# Patient Record
Sex: Male | Born: 1935 | Race: White | Hispanic: No
Health system: Southern US, Community
[De-identification: ages and names within clinical notes are randomized; demographics above are authoritative.]

---

## 2013-02-06 LAB — CBC WITH DIFFERENTIAL/PLATELET
Basophil #: 0 10*3/uL (ref 0.0–0.1)
Basophil %: 0.3 %
HCT: 43.7 % (ref 40.0–52.0)
MCHC: 34.4 g/dL (ref 32.0–36.0)
MCV: 91 fL (ref 80–100)
Neutrophil #: 14.5 10*3/uL — ABNORMAL HIGH (ref 1.4–6.5)
RBC: 4.79 10*6/uL (ref 4.40–5.90)
RDW: 13.9 % (ref 11.5–14.5)

## 2013-02-06 LAB — COMPREHENSIVE METABOLIC PANEL
Albumin: 3.5 g/dL (ref 3.4–5.0)
BUN: 20 mg/dL — ABNORMAL HIGH (ref 7–18)
Bilirubin,Total: 0.9 mg/dL (ref 0.2–1.0)
Calcium, Total: 9.1 mg/dL (ref 8.5–10.1)
Co2: 27 mmol/L (ref 21–32)
Creatinine: 0.98 mg/dL (ref 0.60–1.30)
EGFR (Non-African Amer.): >60
Glucose: 117 mg/dL — ABNORMAL HIGH (ref 65–99)
Osmolality: 276 (ref 275–301)
Potassium: 4.5 mmol/L (ref 3.5–5.1)
SGPT (ALT): 20 U/L (ref 12–78)
Sodium: 136 mmol/L (ref 136–145)

## 2013-02-06 LAB — URINALYSIS, COMPLETE
Bacteria: NONE SEEN
Blood: NEGATIVE
Glucose,UR: NEGATIVE mg/dL (ref 0–75)
Hyaline Cast: 11
Ketone: NEGATIVE
Leukocyte Esterase: NEGATIVE
Nitrite: NEGATIVE
Ph: 6 (ref 4.5–8.0)
RBC,UR: 6 /HPF (ref 0–5)
Specific Gravity: 1.026 (ref 1.003–1.030)
WBC UR: 3 /HPF (ref 0–5)

## 2013-02-06 LAB — PRO B NATRIURETIC PEPTIDE: B-Type Natriuretic Peptide: 5873 pg/mL — ABNORMAL HIGH (ref 0–450)

## 2013-02-06 LAB — TROPONIN I: Troponin-I: 0.03 ng/mL

## 2013-02-06 LAB — PROTIME-INR: INR: 1

## 2013-02-07 ENCOUNTER — Inpatient Hospital Stay: Payer: Self-pay | Admitting: Student

## 2013-02-07 LAB — BASIC METABOLIC PANEL
Calcium, Total: 8.7 mg/dL (ref 8.5–10.1)
Chloride: 104 mmol/L (ref 98–107)
Creatinine: 1.01 mg/dL (ref 0.60–1.30)
EGFR (African American): >60
EGFR (Non-African Amer.): >60
Osmolality: 275 (ref 275–301)
Sodium: 137 mmol/L (ref 136–145)

## 2013-02-07 LAB — CBC WITH DIFFERENTIAL/PLATELET
Basophil #: 0.1 10*3/uL (ref 0.0–0.1)
Eosinophil %: 3.6 %
HCT: 41.4 % (ref 40.0–52.0)
HGB: 14.2 g/dL (ref 13.0–18.0)
Lymphocyte #: 2.3 10*3/uL (ref 1.0–3.6)
MCH: 31.4 pg (ref 26.0–34.0)
MCHC: 34.3 g/dL (ref 32.0–36.0)
MCV: 91 fL (ref 80–100)
Monocyte #: 0.8 x10 3/mm (ref 0.2–1.0)
Monocyte %: 8.5 %
Neutrophil %: 62.4 %
Platelet: 115 10*3/uL — ABNORMAL LOW (ref 150–440)
RBC: 4.53 10*6/uL (ref 4.40–5.90)
RDW: 13.9 % (ref 11.5–14.5)

## 2013-02-07 LAB — LIPID PANEL
HDL Cholesterol: 39 mg/dL — ABNORMAL LOW (ref 40–60)
Ldl Cholesterol, Calc: 98 mg/dL (ref 0–100)

## 2013-06-10 ENCOUNTER — Emergency Department: Payer: Self-pay | Admitting: Emergency Medicine

## 2013-06-10 LAB — CBC WITH DIFFERENTIAL/PLATELET
BASOS ABS: 0.1 10*3/uL (ref 0.0–0.1)
Basophil %: 0.7 %
EOS ABS: 1.6 10*3/uL — AB (ref 0.0–0.7)
EOS PCT: 15.6 %
HCT: 45.7 % (ref 40.0–52.0)
HGB: 15.3 g/dL (ref 13.0–18.0)
Lymphocyte #: 2.8 10*3/uL (ref 1.0–3.6)
Lymphocyte %: 27.4 %
MCH: 31.5 pg (ref 26.0–34.0)
MCHC: 33.4 g/dL (ref 32.0–36.0)
MCV: 94 fL (ref 80–100)
MONOS PCT: 8.1 %
Monocyte #: 0.8 x10 3/mm (ref 0.2–1.0)
Neutrophil #: 4.9 10*3/uL (ref 1.4–6.5)
Neutrophil %: 48.2 %
PLATELETS: 133 10*3/uL — AB (ref 150–440)
RBC: 4.85 10*6/uL (ref 4.40–5.90)
RDW: 13.8 % (ref 11.5–14.5)
WBC: 10.2 10*3/uL (ref 3.8–10.6)

## 2013-06-10 LAB — URINALYSIS, COMPLETE
BILIRUBIN, UR: NEGATIVE
Bacteria: NONE SEEN
Blood: NEGATIVE
GLUCOSE, UR: NEGATIVE mg/dL (ref 0–75)
Ketone: NEGATIVE
Leukocyte Esterase: NEGATIVE
NITRITE: NEGATIVE
Ph: 5 (ref 4.5–8.0)
Protein: NEGATIVE
RBC,UR: NONE SEEN /HPF (ref 0–5)
Specific Gravity: 1.016 (ref 1.003–1.030)
Squamous Epithelial: NONE SEEN
WBC UR: <1 /HPF (ref 0–5)

## 2013-06-10 LAB — BASIC METABOLIC PANEL
ANION GAP: 1 — AB (ref 7–16)
BUN: 16 mg/dL (ref 7–18)
CALCIUM: 9.5 mg/dL (ref 8.5–10.1)
CHLORIDE: 105 mmol/L (ref 98–107)
CREATININE: 1 mg/dL (ref 0.60–1.30)
Co2: 31 mmol/L (ref 21–32)
EGFR (African American): >60
EGFR (Non-African Amer.): >60
Glucose: 98 mg/dL (ref 65–99)
Osmolality: 275 (ref 275–301)
POTASSIUM: 4.8 mmol/L (ref 3.5–5.1)
SODIUM: 137 mmol/L (ref 136–145)

## 2013-06-10 LAB — CK TOTAL AND CKMB (NOT AT ARMC)
CK, Total: 35 U/L — ABNORMAL LOW
CK-MB: 1.4 ng/mL (ref 0.5–3.6)

## 2013-06-10 LAB — TROPONIN I: Troponin-I: <0.02 ng/mL

## 2013-11-23 DEATH — deceased

## 2014-08-15 NOTE — Discharge Summary (Signed)
PATIENT NAME:  Glenn Holt, Glenn Holt MR#:  [id number] DATE OF BIRTH:  November 14, 1917  DATE OF ADMISSION:  02/07/2013 DATE OF DISCHARGE:  02/10/2013  CONSULTANTS: Physical therapy, speech therapy.   CHIEF COMPLAINT: Left-sided weakness.   DISCHARGE DIAGNOSES: 1.  Left-sided weakness with suspected stroke.  2.  Acute respiratory failure secondary to likely aspiration into airway.  3.  Dysphagia.  4.  History of dementia.  5.  Hypertension.  6.  Leukocytosis, possibly from stroke.  7.  Chronic systolic congestive heart failure with ejection fraction of about 35% to 40%.  8.  Carotid arterial disease.  9.  Coronary artery disease, status post bypass.   DISCHARGE MEDICATIONS: Plavix 75 mg daily, isosorbide mononitrate 60 mg once a day, aspirin 81 mg daily, atenolol 50 mg once a day, furosemide 20 mg once a day, loratadine 10 mg once a day, multivitamin 1 tab daily, docusate sodium 100 mg 2 times a day, Nitrostat 0.4 mg sublingual q.5 minutes as needed for pain in the chest x 3, Seroquel 25 mg at bedtime, chlorhexidine swish-and-spit 15 mL twice a day after meals, Mapap 500 mg every 6 hours as needed for fever or pain, triple antibiotics applied b.i.d. p.r.n., simvastatin 10 mg at bedtime, albuterol nebulizers 2.5 mg every 6 hours as needed for shortness of breath, prednisone taper 40 mg a day taper by 10 mg until done in 4 days, lisinopril 2.5 mg daily.   DIET: Low sodium. Diet consistency is pureed with nectar-thick liquids, strict aspiration precautions. Medicines pureed, feeding assistant at all times.   ACTIVITY: As tolerated.   REFERRALS: To hospice.   FOLLOWUP: Please follow with PCP within 1 to 2 weeks. He will go back to [facility] with hospice.   SIGNIFICANT LABS AND IMAGING: Initial BNP was 5873. BUN 20, creatinine 0.98. LDL of 98. LFTs were within normal limits. Troponin was negative. Initial WBC 16.4, last WBC 9.2, initial hemoglobin 15. INR 1. UA did not suggest infection on admission.  Echocardiogram on October 16th showing EF of 35% to 40% with some regional wall motion abnormalities, moderately dilated right and left atrium, moderate to severe mitral valve regurg, moderate tricuspid regurg, moderate RV wall thickness. CT of the head without contrast on admission shows chronic involutional changes without evidence of acute stroke. CT angio of the neck shows no evidence of occlusive vascular disease, there does not appear to be a high degree of stenosis on CT angio. This is in contradiction to the previous Doppler. Stenosis appears to be approximately 50% in both internal carotids. Vascular surgical consult is recommended to assess for possible carotid arteriography. Ultrasound of the carotids bilateral doe on October 15th showing consistent with total occlusion of the internal carotid arteries distal to the carotid bulb. X-ray of the chest, 1 view on October 15th, showing findings suggesting low-grade CHF, no evidence of pneumonia. Repeat x-ray on October 17th showing low-grade CHF.    HISTORY OF PRESENT ILLNESS AND HOSPITAL COURSE: For full details of H and P, please see the dictation on October 15th by Dr. Luberta Mutter but, briefly, this is a 81 year old male with dementia who lives at [facility], brought in by EMS after he was found slumped over to the left side while he was using the toilet and brought in for TIA evaluation. He has been having some cough for about a week. No fever was documented. He was admitted to the hospitalist service for suspected stroke. He had a CT of the head which was negative for  acute stroke but did have EKG showing A. fib.  There is no clear history of arteriovenous but we do not have any previous EKGs. He was admitted to telemetry with possible stroke. Given the A. fib and the patient's presentation, stroke was suspected. Furthermore, carotid ultrasound was done showing significant 100% stenosis. Echocardiogram was also done showing EF of 35% to 40%. Given the  ultrasound showing carotid stenosis, MRI and MRA was ordered; however, the patient has history of renal stents and as they could not get information about the stents MRI was not done. Initially, the family did not want to pursue the MRI, MRA as the patient is not a candidate for aggressive management and the family does not want aggressive measures, and he was continued on aspirin and Plavix. Statin was started; however, the case was discussed with another son who wondered about the possibility of CT angio. A CT angiogram of the neck was done which showed no evidence of occlusive vascular disease. I discussed the case with the patient's son again about the findings. The patient has another son who is a physician. The plan at this point was get images and show to another neurosurgeon that they are familiar with. However, at this point as the CT angio does not show significant occlusion which was seen on the ultrasound, conservative and wait-and-watch approach will be taken.   LDL was checked. The patient was also seen by PT. The patient also does have atrial fibrillation as stated above and it is unclear if this is acute or chronic but, this point, he is not on anticoagulation. It is possible that this might be embolic but, given the overall situation, I do not know if the family wants aggressive measures such as Coumadin They will go ahead and think about it. The patient was seen by speech. He does have evidence for dysphagia and he does cough with eating. He did have acute respiratory failure, wheezing, and aspiration is suspected. He was started on some albuterol and nebs, steroids. He was also started on some antibiotics initially. He has done better. He is tolerating his diet. His strength has improved. He was seen by PT. The family is wanting to transfer him back with hospice at [facility] and that will be arranged. He did have echo showing EF of 35% to 40% but he does not appear to be in acute congestive  heart failure. He has a history of CHF and he is on Lasix already. Lisinopril was added to his medication as he is not on it as an outpatient. He did have leukocytosis which possibly is stress mediated and this has resolved. He has received multiple days of IV ceftriaxone and some azithromycin and, at this point, he will not be discharged on antibiotics. He will be sent on steroid taper as well as albuterol nebs. At this point, he will be discharged to outpatient followup.   PHYSICAL EXAMINATION: VITAL SIGNS: On the day of discharge, his temperature is 98, pulse rate 51, respiratory rate 18, blood pressure 158/54. O2 sat has been in the low to high 90s in the last 24 hours, today sat is 91% on room air.  GENERAL: The patient is a well-developed, elderly male laying in bed, confused but pleasant, following all commands.  HEENT: Normocephalic, atraumatic.  LUNGS: Showed no significant wheezing but is a little coarse on the left upper lobe. No significant crackles.  HEART:  Irregularly irregular on auscultation of his heart sounds.  ABDOMEN: Benign. The patient  has no significant lower extremity edema.   DISPOSITION:  At this point, he will be discharged with outpatient followup with hospice.   TOTAL TIME SPENT: 40 minutes.   CODE STATUS: The patient is DO NOT RESUSCITATE.   ____________________________ Krystal Eaton, MD sa:cs D: 02/10/2013 12:09:00 ET T: 02/10/2013 20:16:03 ET JOB#: [id number]  cc: Krystal Eaton, MD, <Dictator> Krystal Eaton MD ELECTRONICALLY SIGNED 02/22/2013 14:54

## 2014-08-15 NOTE — H&P (Signed)
PATIENT NAME:  Glenn Holt, Glenn Holt MR#:  [id number] DATE OF BIRTH:  [birth year]  DATE OF ADMISSION:  02/06/2013  PRIMARY CARE PHYSICIAN: From   [facility]  The patient is from [place], [place].   CHIEF COMPLAINT: Left side weakness and possible transient ischemic attack.   HISTORY OF PRESENT ILLNESS: A 81 year old male with dementia who lives at [facility] brought in by the EMS because he was slumped over to the left side while he was using the toilet. The patient was brought here for TIA evaluation. The patient has dementia, so not really a good history obtained from him. The patient thinks he is in [place].  The patient's son says he has had a cough for about 1 week.  No fever was documented. The patient has been living at [facility] recently,.  Before that he was in [place]. According to the son, he had pneumonia 2 years ago. The patient did not have any slurred speech and no evidence of seizure activity. No evidence of trauma. The patient did not lose consciousness. He denies any recent history of travel in the last 21 days to [place] countries.   PAST MEDICAL HISTORY: Significant for history of dementia, hypertension, CHF and coronary artery disease, status post bypass.   SOCIAL HISTORY: No smoking. Occasional drinking, no drugs. Lives at [facility]   PAST SURGICAL HISTORY: Significant for quadruple bypass.   FAMILY HISTORY: Significant for hyperlipidemia in his brothers and sisters.   PAST SURGICAL HISTORY: Significant for bypass surgery 20 years ago.   MEDICATIONS:   1.  Aspirin 81 mg daily. 2.  Atenolol 50 mg p.o. daily. 3.  Chlorhexidine mouth wash swish and spit as needed. 4.  Colace 100 mg p.o. daily.  5.  Lasix 20 mg p.o. daily.  6.  Imdur 60 mg p.o. extended release daily.  7.  Loratadine 10 mg p.o. daily.  8. tylenol  500 mg every 6 hours as needed for pain and fever.  9.  Multivitamin 1 tablet daily.  10.  Plavix 75 mg p.o. daily. y.  71.  Nitrostat  sublingual as needed 0.4 mg for chest pain.  13.  Aldactone 25 mg p.o. daily.  14.  Triple antibiotics 400/3.08/4998 international units per gram to accepted area b.i.d.   REVIEW OF SYSTEMS: The patient has dementia, so review of systems is really not able to give but does have a cough, which has been going on for a week.   PHYSICAL EXAMINATION: VITAL SIGNS:  Temperature in the ER 99.2, heart rate 79, blood pressure 130/61, sats 92% on room air.  GENERAL: The patient is awake but oriented to name only. He thinks he is in [place], not in distress.  HEENT: Head normocephalic, atraumatic.  EYES: Pupils equally reacting to light. Extraocular movements intact.  NOSE: No turbinate hypertrophy. No drainage.  EARS: No tympanic membrane congestion.  MOUTH: No lesions. No exudates.  NECK: Supple. No JVD. No carotid bruit. Thyroid is in the midline. Normal range of motion.  RESPIRATORY: The patient has bilateral expiratory wheeze in upper lung zones not using accessory muscles of respiration.  CARDIOVASCULAR: Irregularly irregular. The patient is in AFib.  The patient has trace peripheral edema.  GASTROINTESTINAL: Abdomen is soft, nontender, nondistended. Bowel sounds present.  MUSCULOSKELETAL: The patient able to move all extremities. Strength is equal bilaterally.  SKIN: Normal, well-hydrated, no diaphoresis.  NEUROLOGIC: Cranial nerves II through XII intact. The patient's DTRs 2+ bilaterally, upper and lower extremities. Muscle power is  intact. Sensations are intact. Motor exam as I mentioned 4 out of 4 in upper and lower extremities.  PSYCHIATRIC: Mood is stable. No hallucinations.  Oriented to name only, not oriented to place.   LABORATORY DATA:  WBC elevated at 16.4, hemoglobin 15, hematocrit 43.7, platelets 126.   Electrolytes: Sodium is 136, potassium 4.5, chloride 102, bicarbonate 27, BUN 20, creatinine 0.98, glucose 117. Troponin 0.03. UA is hazy-colored urine with WBCs 3 and bacteria none.  CT head shows chronic involutional changes with no evidence of acute abnormalities. No mass effect. No skull fractures.  Paranasal sinuses normal. Chest x-ray shows low-grade CHF. No evidence of pneumonia. BNP is elevated at 5873.   EKG shows atrial fibrillation, 69 beats per minute, T wave inversions in V1 and V2.  The patient is the first time coming to this hospital, so no previous EKGs to compare.   ASSESSMENT AND PLAN:   1.  The patient is a 81 year old male with left-sided weakness. The patient's CT head is negative.  The patient is neurologically intact. I doubt it is transient ischemic attack but due to his atrial fibrillation, we are going to keep him on monitor and observe him overnight.  Get an MRI of the brain, carotid ultrasound, echocardiogram and continue aspirin. Get physical therapy evaluation. Get fasting lipase and monitor his blood pressure.  2.  Acute bronchitis with cough and low-grade fever, hypoxia.  The patient's white count is also up so we are going to continue the Rocephin, Zithromax, along with inhalers and repeat chest x-ray tomorrow.  3.  Possible congestive heart failure, unknown systolic or diastolic.  Never had workup here. The patient has a history of coronary artery disease and bypass. BNP is slightly elevated but he has no pedal edema. The patient is on Lasix at home along with Aldactone and beta blockers. Going to continue them along with Imdur.   4.  History of dementia. He is on Seroquel.  We will continue that.  5.  CODE STATUS: Discussed with the son, but the patient is full code and the family will let us know about code status sometime today.   6.  We will also get speech therapy evaluation because of his cough and episodes of pneumonia to see what diet he can safely take.   TIME SPENT: 55 minutes    ____________________________ Katha HammingSnehalatha Sammie Schermerhorn, MD sk:dp D: 02/06/2013 11:23:00 ET T: 02/06/2013 12:19:27 ET JOB#: [id number]  cc: Katha HammingSnehalatha Melat Wrisley, MD,  <Dictator> Katha HammingSNEHALATHA Margrit Minner MD ELECTRONICALLY SIGNED 03/13/2013 12:04

## 2015-06-27 IMAGING — CT CT ANGIOGRAPHY NECK
1 of 4 series · 12 of 33 positions shown · non-contrast
Comparison: none

REASON FOR EXAM: us shows b/l stenosis. mra ordered but pt with stent.
pleae eval the carotids.
COMMENTS:

[Series 4: soft tissue · axial · 0.42mm/px · z∈[-222,+26]mm · 12 of 99 slices shown]
[im 8/99  soft-tissue]
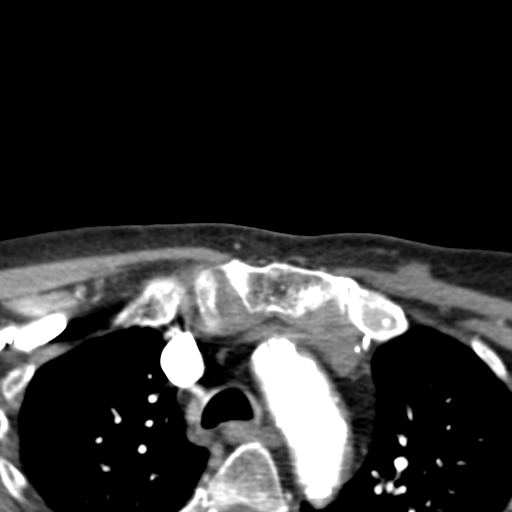
[im 16/99  bone]
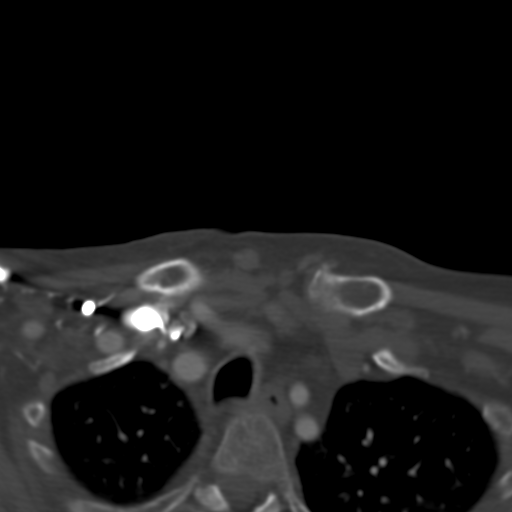
[im 23/99  soft-tissue]
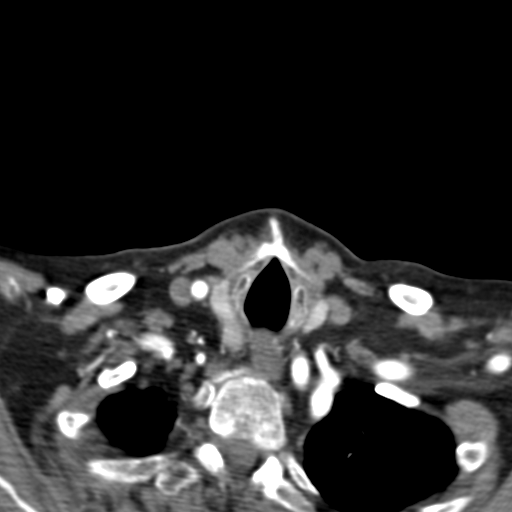
[im 31/99  bone]
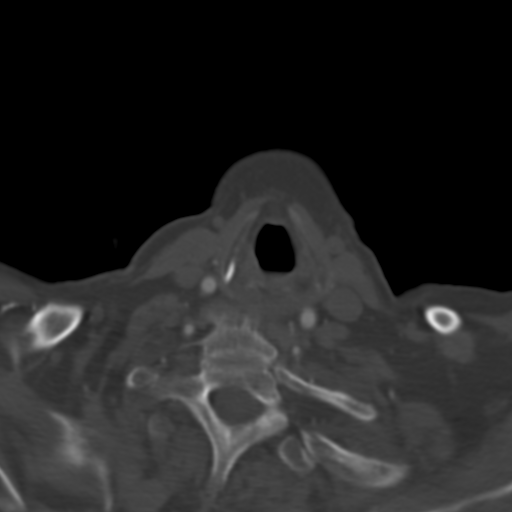
[im 38/99  soft-tissue]
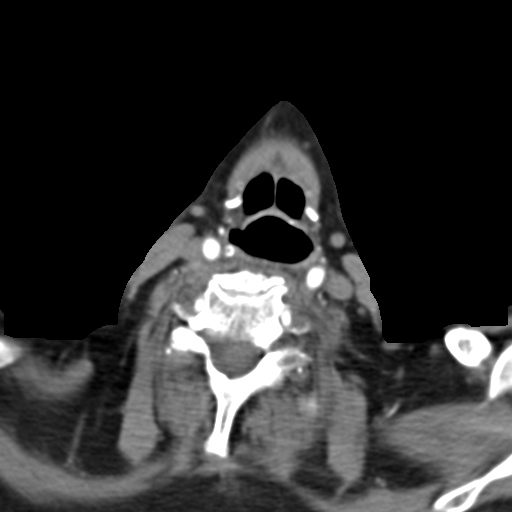
[im 46/99  bone]
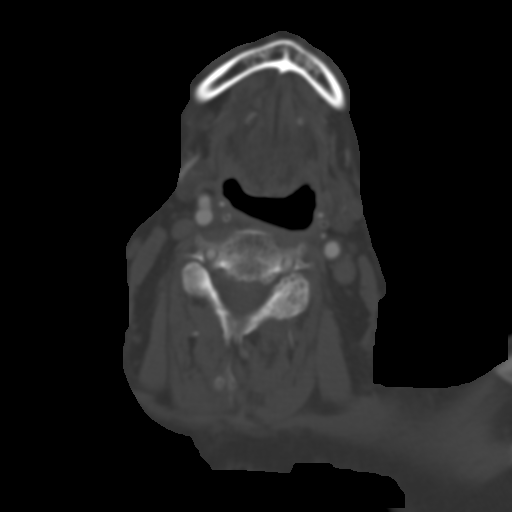
[im 53/99  soft-tissue]
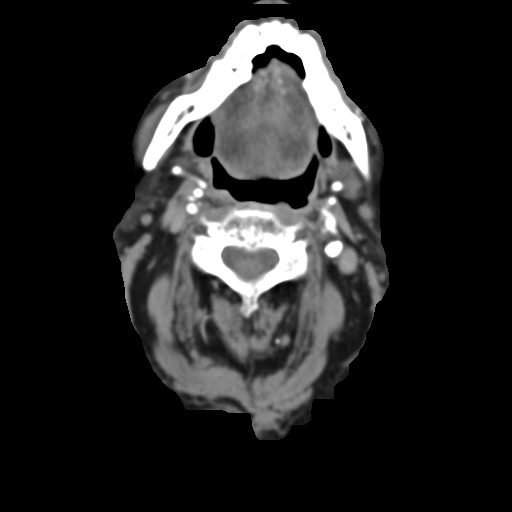
[im 61/99  bone]
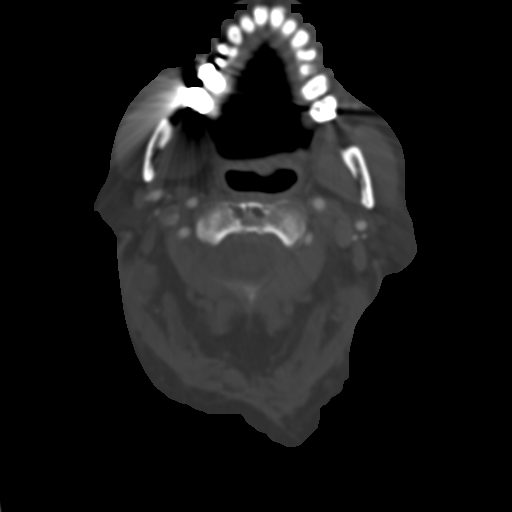
[im 68/99  soft-tissue]
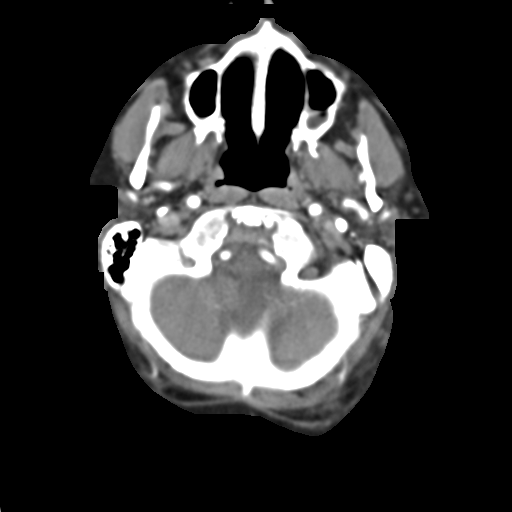
[im 76/99  bone]
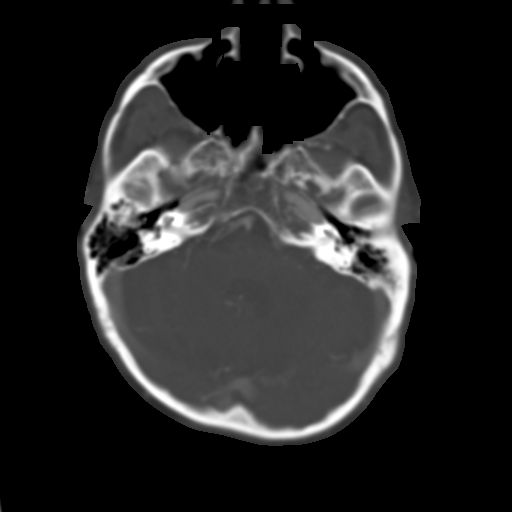
[im 83/99  soft-tissue]
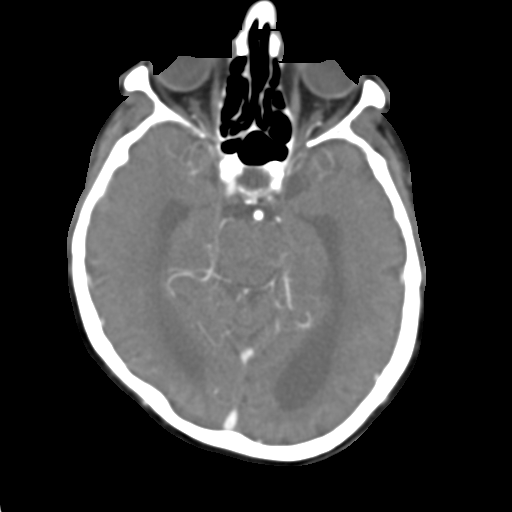
[im 91/99  bone]
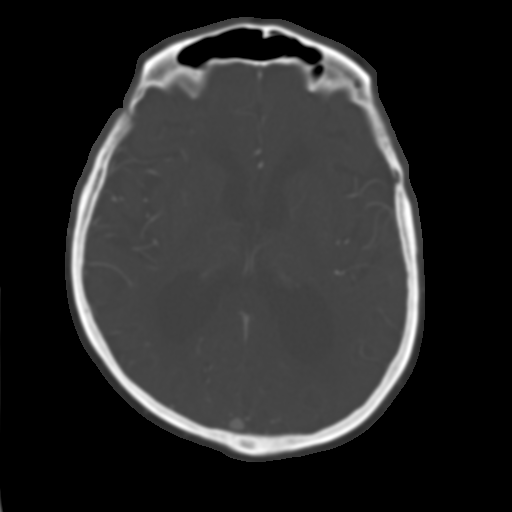

[12 of 33 positions shown; findings below may reference images not displayed]

PROCEDURE:     CT  - CT ANGIOGRAPHY NECK W/CONTRAST  - February 09, 2013  [DATE]

RESULT:     Carotids CTA is performed utilizing 100 mL of 1sovue-RWD
iodinated intravenous contrast with images reconstructed in the axial plane
at 3.0 mm slice thickness along with multiplanar axial, coronal and
three-dimensional images including maximum intensity pixel projection image
is with [employer] and vessel view.

The common carotid arteries appear to be widely patent from the aortic arch
on the right and brachiocephalic from the left. There is prominent
atherosclerotic calcification in the carotid bifurcation regions and in the
internal carotids bilaterally with the carotid bulb calcification greater on
the left and internal carotid calcification somewhat greater on the right.
There does not appear to be a high degree of stenosis. This is in
contradiction to the previous Doppler interrogation. Stenosis appears to be
approximately 50% in both internal carotids. The anterior cerebral, middle
cerebral and posterior cerebral circulation as well as the basilar and
vertebral arteries appear to be unremarkable. Communicating arteries are not
well demonstrated. No aneurysm is appreciated. There is intracranial atrophy
with chronic microvascular ischemic disease.
IMPRESSION: No evidence of occlusive vascular disease. There does not
appear to be a high degree of stenosis on CTA. Vascular surgical
consultation is recommended to assess for the of possible carotid
arteriography.

[REDACTED]
